# Patient Record
Sex: Female | Born: 1940 | Race: White | Hispanic: No | Marital: Married | Smoking: Never smoker
Health system: Southern US, Community
[De-identification: ages and names within clinical notes are randomized; demographics above are authoritative.]

---

## 2015-02-10 DIAGNOSIS — M75111 Incomplete rotator cuff tear or rupture of right shoulder, not specified as traumatic: Secondary | ICD-10-CM | POA: Insufficient documentation

## 2015-07-22 DIAGNOSIS — M1711 Unilateral primary osteoarthritis, right knee: Secondary | ICD-10-CM | POA: Insufficient documentation

## 2016-06-23 DIAGNOSIS — E663 Overweight: Secondary | ICD-10-CM | POA: Insufficient documentation

## 2016-06-23 DIAGNOSIS — G4733 Obstructive sleep apnea (adult) (pediatric): Secondary | ICD-10-CM | POA: Insufficient documentation

## 2017-01-05 DIAGNOSIS — L02512 Cutaneous abscess of left hand: Secondary | ICD-10-CM | POA: Insufficient documentation

## 2017-01-05 DIAGNOSIS — M009 Pyogenic arthritis, unspecified: Secondary | ICD-10-CM | POA: Insufficient documentation

## 2017-01-05 DIAGNOSIS — E039 Hypothyroidism, unspecified: Secondary | ICD-10-CM | POA: Insufficient documentation

## 2017-01-05 DIAGNOSIS — E785 Hyperlipidemia, unspecified: Secondary | ICD-10-CM | POA: Insufficient documentation

## 2017-01-05 DIAGNOSIS — A4901 Methicillin susceptible Staphylococcus aureus infection, unspecified site: Secondary | ICD-10-CM | POA: Insufficient documentation

## 2017-01-06 DIAGNOSIS — Z9889 Other specified postprocedural states: Secondary | ICD-10-CM | POA: Insufficient documentation

## 2019-12-29 ENCOUNTER — Other Ambulatory Visit: Payer: Self-pay

## 2019-12-29 ENCOUNTER — Ambulatory Visit: Payer: Medicare HMO | Admitting: Podiatry

## 2019-12-29 ENCOUNTER — Ambulatory Visit (INDEPENDENT_AMBULATORY_CARE_PROVIDER_SITE_OTHER): Payer: Medicare HMO

## 2019-12-29 ENCOUNTER — Encounter: Payer: Self-pay | Admitting: Podiatry

## 2019-12-29 VITALS — BP 110/65 | HR 49 | Temp 98.0°F | Resp 16

## 2019-12-29 DIAGNOSIS — M79671 Pain in right foot: Secondary | ICD-10-CM

## 2019-12-29 DIAGNOSIS — M7741 Metatarsalgia, right foot: Secondary | ICD-10-CM

## 2019-12-29 DIAGNOSIS — M779 Enthesopathy, unspecified: Secondary | ICD-10-CM

## 2019-12-29 DIAGNOSIS — M2012 Hallux valgus (acquired), left foot: Secondary | ICD-10-CM

## 2019-12-29 DIAGNOSIS — M7742 Metatarsalgia, left foot: Secondary | ICD-10-CM

## 2019-12-29 DIAGNOSIS — K219 Gastro-esophageal reflux disease without esophagitis: Secondary | ICD-10-CM | POA: Insufficient documentation

## 2019-12-29 DIAGNOSIS — M8580 Other specified disorders of bone density and structure, unspecified site: Secondary | ICD-10-CM | POA: Insufficient documentation

## 2019-12-29 DIAGNOSIS — G5760 Lesion of plantar nerve, unspecified lower limb: Secondary | ICD-10-CM | POA: Insufficient documentation

## 2019-12-29 DIAGNOSIS — M79672 Pain in left foot: Secondary | ICD-10-CM

## 2019-12-29 NOTE — Patient Instructions (Addendum)
Bunion  A bunion is a bump on the base of the big toe that forms when the bones of the big toe joint move out of position. Bunions may be small at first, but they often get larger over time. They can make walking painful. What are the causes? A bunion may be caused by:  Wearing narrow or pointed shoes that force the big toe to press against the other toes.  Abnormal foot development that causes the foot to roll inward (pronate).  Changes in the foot that are caused by certain diseases, such as rheumatoid arthritis or polio.  A foot injury. What increases the risk? The following factors may make you more likely to develop this condition:  Wearing shoes that squeeze the toes together.  Having certain diseases, such as: ? Rheumatoid arthritis. ? Polio. ? Cerebral palsy.  Having family members who have bunions.  Being born with a foot deformity, such as flat feet or low arches.  Doing activities that put a lot of pressure on the feet, such as ballet dancing. What are the signs or symptoms? The main symptom of a bunion is a noticeable bump on the big toe. Other symptoms may include:  Pain.  Swelling around the big toe.  Redness and inflammation.  Thick or hardened skin on the big toe or between the toes.  Stiffness or loss of motion in the big toe.  Trouble with walking. How is this diagnosed? A bunion may be diagnosed based on your symptoms, medical history, and activities. You may have tests, such as:  X-rays. These allow your health care provider to check the position of the bones in your foot and look for damage to your joint. They also help your health care provider determine the severity of your bunion and the best way to treat it.  Joint aspiration. In this test, a sample of fluid is removed from the toe joint. This test may be done if you are in a lot of pain. It helps rule out diseases that cause painful swelling of the joints, such as arthritis. How is this  treated? Treatment depends on the severity of your symptoms. The goal of treatment is to relieve symptoms and prevent the bunion from getting worse. Your health care provider may recommend:  Wearing shoes that have a wide toe box.  Using bunion pads to cushion the affected area.  Taping your toes together to keep them in a normal position.  Placing a device inside your shoe (orthotics) to help reduce pressure on your toe joint.  Taking medicine to ease pain, inflammation, and swelling.  Applying heat or ice to the affected area.  Doing stretching exercises.  Surgery to remove scar tissue and move the toes back into their normal position. This treatment is rare. Follow these instructions at home: Managing pain, stiffness, and swelling   If directed, put ice on the painful area: ? Put ice in a plastic bag. ? Place a towel between your skin and the bag. ? Leave the ice on for 20 minutes, 2-3 times a day. Activity   If directed, apply heat to the affected area before you exercise. Use the heat source that your health care provider recommends, such as a moist heat pack or a heating pad. ? Place a towel between your skin and the heat source. ? Leave the heat on for 20-30 minutes. ? Remove the heat if your skin turns bright red. This is especially important if you are unable to feel pain,   heat, or cold. You may have a greater risk of getting burned.  Do exercises as told by your health care provider. General instructions  Support your toe joint with proper footwear, shoe padding, or taping as told by your health care provider.  Take over-the-counter and prescription medicines only as told by your health care provider.  Keep all follow-up visits as told by your health care provider. This is important. Contact a health care provider if your symptoms:  Get worse.  Do not improve in 2 weeks. Get help right away if you have:  Severe pain and trouble with walking. Summary  A  bunion is a bump on the base of the big toe that forms when the bones of the big toe joint move out of position.  Bunions can make walking painful.  Treatment depends on the severity of your symptoms.  Support your toe joint with proper footwear, shoe padding, or taping as told by your health care provider. This information is not intended to replace advice given to you by your health care provider. Make sure you discuss any questions you have with your health care provider. Document Revised: 05/30/2018 Document Reviewed: 04/05/2018 Elsevier Patient Education  2020 Elsevier Inc.    Hammer Toe  Hammer toe is a change in the shape (a deformity) of your toe. The deformity causes the middle joint of your toe to stay bent. This causes pain, especially when you are wearing shoes. Hammer toe starts gradually. At first, the toe can be straightened. Gradually over time, the deformity becomes stiff and permanent. Early treatments to keep the toe straight may relieve pain. As the deformity becomes stiff and permanent, surgery may be needed to straighten the toe. What are the causes? Hammer toe is caused by abnormal bending of the toe joint that is closest to your foot. It happens gradually over time. This pulls on the muscles and connections (tendons) of the toe joint, making them weak and stiff. It is often related to wearing shoes that are too short or narrow and do not let your toes straighten. What increases the risk? You may be at greater risk for hammer toe if you:  Are female.  Are older.  Wear shoes that are too small.  Wear high-heeled shoes that pinch your toes.  Are a ballet dancer.  Have a second toe that is longer than your big toe (first toe).  Injure your foot or toe.  Have arthritis.  Have a family history of hammer toe.  Have a nerve or muscle disorder. What are the signs or symptoms? The main symptoms of this condition are pain and deformity of the toe. The pain is  worse when wearing shoes, walking, or running. Other symptoms may include:  Corns or calluses over the bent part of the toe or between the toes.  Redness and a burning feeling on the toe.  An open sore that forms on the top of the toe.  Not being able to straighten the toe. How is this diagnosed? This condition is diagnosed based on your symptoms and a physical exam. During the exam, your health care provider will try to straighten your toe to see how stiff the deformity is. You may also have tests, such as:  A blood test to check for rheumatoid arthritis.  An X-ray to show how severe the deformity is. How is this treated? Treatment for this condition will depend on how stiff the deformity is. Surgery is often needed. However, sometimes a hammer   toe can be straightened without surgery. Treatments that do not involve surgery include:  Taping the toe into a straightened position.  Using pads and cushions to protect the toe (orthotics).  Wearing shoes that provide enough room for the toes.  Doing toe-stretching exercises at home.  Taking an NSAID to reduce pain and swelling. If these treatments do not help or the toe cannot be straightened, surgery is the next option. The most common surgeries used to straighten a hammer toe include:  Arthroplasty. In this procedure, part of the joint is removed, and that allows the toe to straighten.  Fusion. In this procedure, cartilage between the two bones of the joint is taken out and the bones are fused together into one longer bone.  Implantation. In this procedure, part of the bone is removed and replaced with an implant to let the toe move again.  Flexor tendon transfer. In this procedure, the tendons that curl the toes down (flexor tendons) are repositioned. Follow these instructions at home:  Take over-the-counter and prescription medicines only as told by your health care provider.  Do toe straightening and stretching exercises as  told by your health care provider.  Keep all follow-up visits as told by your health care provider. This is important. How is this prevented?  Wear shoes that give your toes enough room and do not cause pain.  Do not wear high-heeled shoes. Contact a health care provider if:  Your pain gets worse.  Your toe becomes red or swollen.  You develop an open sore on your toe. This information is not intended to replace advice given to you by your health care provider. Make sure you discuss any questions you have with your health care provider. Document Revised: 11/05/2017 Document Reviewed: 03/18/2016 Elsevier Patient Education  2020 Elsevier Inc.  

## 2019-12-29 NOTE — Progress Notes (Signed)
Subjective:    Patient ID: Doris Griffin, female    DOB: 15-Jul-1941, 79 y.o.   MRN: [id number]  HPI  79 year old female presents the office with concerns of bilateral foot pain mostly on the forefoot bilaterally as well as the toes on the right third and fourth toes.  She states that she is present bunion surgery in the right sinus ago and she is concerned that she has a neuroma as she has the numbness to her toes.  She feels that she is walking on rocks in the balls of her feet. No recent injury or trauma. No swelling. No radiating pain or weakness.     Review of Systems  All other systems reviewed and are negative.  History reviewed. No pertinent past medical history.  History reviewed. No pertinent surgical history.   Current Outpatient Medications:  .  aspirin 81 MG EC tablet, Take by mouth., Disp: , Rfl:  .  atorvastatin (LIPITOR) 10 MG tablet, Take by mouth., Disp: , Rfl:  .  calcium-vitamin D (OSCAL WITH D) 500-200 MG-UNIT TABS tablet, Take by mouth., Disp: , Rfl:  .  co-enzyme Q-10 30 MG capsule, Take by mouth., Disp: , Rfl:  .  fluticasone (FLONASE) 50 MCG/ACT nasal spray, Place into the nose., Disp: , Rfl:  .  levothyroxine (SYNTHROID) 50 MCG tablet, TAKE 1 TABLET EVERY DAY AT 6AM, Disp: , Rfl:  .  Lutein 6 MG TABS, Take by mouth., Disp: , Rfl:  .  Misc. Devices MISC, New cpap machine  at 7 cm. Water and mask fitting DME: [facility] fax: [phone], Disp: , Rfl:  .  Multiple Vitamin (MULTIVITAMIN) tablet, Take by mouth., Disp: , Rfl:  .  nitroGLYCERIN (NITROSTAT) 0.4 MG SL tablet, Place under the tongue., Disp: , Rfl:  .  Omega-3 Fatty Acids (FISH OIL) 1000 MG CAPS, Take by mouth., Disp: , Rfl:  .  omeprazole (PRILOSEC) 20 MG capsule, TAKE 1 CAPSULE BY MOUTH EVERY DAY, Disp: , Rfl:   Allergies  Allergen Reactions  . Other Other (See Comments)    Analgesics- GI irritation Analgesics- GI irritation          Objective:   Physical Exam  General: AAO x3,  NAD  Dermatological: Hyperkeratotic lesions present on the right lateral third toe and medial fourth digit.  No ongoing ulceration, drainage or any signs of infection are noted.  No open lesions otherwise.  Vascular: Dorsalis Pedis artery and Posterior Tibial artery pedal pulses are 2/4 bilateral with immedate capillary fill time. There is no pain with calf compression, swelling, warmth, erythema.   Neruologic: Overall sensation appears to be intact with minimal decrease in the digits.  Musculoskeletal: Prominent metatarsal heads plantarly with atrophy of fat pad.  Not able to identify any area of neuroma I can palpate.  On the right foot there is medial deviation of the lesser digits.  The left foot there is significant bunion deformity identified.  Muscular strength 5/5 in all groups tested bilateral.  Gait: Unassisted, Nonantalgic.     Assessment & Plan:  79 year old female with hyperkeratotic lesions due to digital deformity with metatarsalgia -Treatment options discussed including all alternatives, risks, and complications -Etiology of symptoms were discussed -X-rays were obtained and reviewed with the patient. Significant bunion is present in the left side.  Medial deviation of the lesser digits on the right side.  No evidence of acute fracture. -I debrided the hyperkeratotic lesions x2 without any complications or bleeding.  Dispensed offloading pads.  Discussed shoe  modifications. -Discussed metatarsal offloading pads which were also dispensed.-Discussed shoe modifications and orthotics for the metatarsalgia.  Return if symptoms worsen or fail to improve.  Trula Slade DPM

## 2020-01-01 ENCOUNTER — Ambulatory Visit: Payer: Medicare HMO | Admitting: Podiatry

## 2020-05-10 ENCOUNTER — Other Ambulatory Visit: Payer: Self-pay

## 2020-05-10 ENCOUNTER — Encounter: Payer: Self-pay | Admitting: Podiatry

## 2020-05-10 ENCOUNTER — Ambulatory Visit: Payer: Medicare HMO | Admitting: Podiatry

## 2020-05-10 VITALS — Temp 97.7°F

## 2020-05-10 DIAGNOSIS — M2012 Hallux valgus (acquired), left foot: Secondary | ICD-10-CM | POA: Insufficient documentation

## 2020-05-10 DIAGNOSIS — M79672 Pain in left foot: Secondary | ICD-10-CM | POA: Diagnosis not present

## 2020-05-10 DIAGNOSIS — M2032 Hallux varus (acquired), left foot: Secondary | ICD-10-CM | POA: Diagnosis not present

## 2020-05-10 NOTE — Progress Notes (Signed)
Subjective: 79 year old female presents the office today for surgical consultation of a left bunion, second hammertoe deformity.  She previously has had bunion as well as hammertoe repair on her right foot has done well and the left side is becoming more painful and she wants go to proceed with surgical invention.  She tried shoe modification,, footing and padding the insignificant improvement at this point she wished to proceed with surgery. Denies any systemic complaints such as fevers, chills, nausea, vomiting. No acute changes since last appointment, and no other complaints at this time.   Objective: AAO x3, NAD DP/PT pulses palpable 2/4 bilaterally, CRT less than 3 seconds Moderate bunion deformities present with erythema to the medial first metatarsal head from irritation inside shoes.  There is no pain or crepitation or restriction of first MPJ range of motion.  Tenderness is directly on the first metatarsal head medially along the bunion.  Hammertoe contractures present with mild erythema the dorsal PIPJ.  No open lesions or pre-ulcerative lesions.  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  Plan: -All treatment options discussed with the patient including all alternatives, risks, complications.  -I reviewed the x-rays with her and we discussed with conservative as well as surgical treatment options at length.  At this time she like to proceed with surgical intervention as she is attempted numerous conservative care without any significant improvement.  She understands this is not a guarantee in the toe may not be as straight as the other side.  She states that she is not excellent cosmetic appearance only the bump which is causing discomfort. -The incision placement as well as the postoperative course was discussed with the patient. I discussed risks of the surgery which include, but not  limited to, infection, bleeding, pain, swelling, need for further surgery, delayed or nonhealing, painful or ugly scar, numbness or sensation changes, over/under correction, recurrence, transfer lesions, further deformity, hardware failure, DVT/PE, loss of toe/foot. Patient understands these risks and wishes to proceed with surgery. The surgical consent was reviewed with the patient all 3 pages were signed. No promises or guarantees were given to the outcome of the procedure. All questions were answered to the best of my ability. Before the surgery the patient was encouraged to call the office if there is any further questions. The surgery will be performed at the [facility] on an outpatient basis. -CAM boot dispensed for postop use.  -Patient encouraged to call the office with any questions, concerns, change in symptoms.   Vivi Barrack DPM

## 2020-05-10 NOTE — Patient Instructions (Signed)
Pre-Operative Instructions  Congratulations, you have decided to take an important step towards improving your quality of life.  You can be assured that the doctors and staff at [facility] will be with you every step of the way.  Here are some important things you should know:  1. Plan to be at the surgery center/hospital at least 1 (one) hour prior to your scheduled time, unless otherwise directed by the surgical center/hospital staff.  You must have a responsible adult accompany you, remain during the surgery and drive you home.  Make sure you have directions to the surgical center/hospital to ensure you arrive on time. 2. If you are having surgery at [facility] or [facility] hospitals, you will need a copy of your medical history and physical form from your family physician within one month prior to the date of surgery. We will give you a form for your primary physician to complete.  3. We make every effort to accommodate the date you request for surgery.  However, there are times where surgery dates or times have to be moved.  We will contact you as soon as possible if a change in schedule is required.   4. No aspirin/ibuprofen for one week before surgery.  If you are on aspirin, any non-steroidal anti-inflammatory medications (Mobic, Aleve, Ibuprofen) should not be taken seven (7) days prior to your surgery.  You make take Tylenol for pain prior to surgery.  5. Medications - If you are taking daily heart and blood pressure medications, seizure, reflux, allergy, asthma, anxiety, pain or diabetes medications, make sure you notify the surgery center/hospital before the day of surgery so they can tell you which medications you should take or avoid the day of surgery. 6. No food or drink after midnight the night before surgery unless directed otherwise by surgical center/hospital staff. 7. No alcoholic beverages 24-hours prior to surgery.  No smoking 24-hours prior or 24-hours after  surgery. 8. Wear loose pants or shorts. They should be loose enough to fit over bandages, boots, and casts. 9. Don't wear slip-on shoes. Sneakers are preferred. 10. Bring your boot with you to the surgery center/hospital.  Also bring crutches or a walker if your physician has prescribed it for you.  If you do not have this equipment, it will be provided for you after surgery. 11. If you have not been contacted by the surgery center/hospital by the day before your surgery, call to confirm the date and time of your surgery. 12. Leave-time from work may vary depending on the type of surgery you have.  Appropriate arrangements should be made prior to surgery with your employer. 13. Prescriptions will be provided immediately following surgery by your doctor.  Fill these as soon as possible after surgery and take the medication as directed. Pain medications will not be refilled on weekends and must be approved by the doctor. 14. Remove nail polish on the operative foot and avoid getting pedicures prior to surgery. 15. Wash the night before surgery.  The night before surgery wash the foot and leg well with water and the antibacterial soap provided. Be sure to pay special attention to beneath the toenails and in between the toes.  Wash for at least three (3) minutes. Rinse thoroughly with water and dry well with a towel.  Perform this wash unless told not to do so by your physician.  Enclosed: 1 Ice pack (please put in freezer the night before surgery)   1 Hibiclens skin cleaner     Pre-op instructions  If you have any questions regarding the instructions, please do not hesitate to call our office.  [place]: [address], [place], [place] [address] -- [phone]  [place]: [address] [place], [place] [address] -- [phone]  [place]: [address], [place], [place] [address] -- [phone]   Website: [email or web address] 

## 2020-06-28 ENCOUNTER — Encounter: Payer: Medicare HMO | Admitting: Podiatry

## 2020-07-04 DIAGNOSIS — G8929 Other chronic pain: Secondary | ICD-10-CM | POA: Insufficient documentation

## 2020-07-04 DIAGNOSIS — R6889 Other general symptoms and signs: Secondary | ICD-10-CM | POA: Insufficient documentation

## 2020-07-05 ENCOUNTER — Encounter: Payer: Medicare HMO | Admitting: Podiatry

## 2020-07-16 ENCOUNTER — Telehealth: Payer: Self-pay | Admitting: Podiatry

## 2020-07-17 NOTE — Telephone Encounter (Signed)
I have sx scheduled in a week from today. I'm trying my best to fulfill the information at [facility]. I'm having a tough time. I tried calling the pre-op nurse they suggest you call and I couldn't get an answer, the phones were not working yesterday, I could not get the information filled out on the computer, I don't know what's the matter. I'm just wondering now should I have sx at such a place. Please call me and advise me what to do. Thank you.

## 2020-07-17 NOTE — Telephone Encounter (Signed)
I have sx scheduled on 08/18 and I have some questions. If you could call me on my home number. Thank you.

## 2020-07-19 ENCOUNTER — Encounter: Payer: Medicare HMO | Admitting: Podiatry

## 2020-07-22 ENCOUNTER — Telehealth: Payer: Self-pay | Admitting: Podiatry

## 2020-07-22 NOTE — Telephone Encounter (Signed)
DOS: 07/24/2020  [employer] [place] [employer] [phone])  [employer] [id number])  [employer] Effective: 12/08/2019 -  Deductible: $0. Out of Pocket: $5,000 with $428.75 met and $4,571.25 remains. Copay: $200 CoInsurance: 100%  Per Diane J no prior authorizations is required. Call Ref# [id number].

## 2020-07-24 ENCOUNTER — Other Ambulatory Visit: Payer: Self-pay | Admitting: Podiatry

## 2020-07-24 ENCOUNTER — Encounter: Payer: Self-pay | Admitting: Podiatry

## 2020-07-24 DIAGNOSIS — M2042 Other hammer toe(s) (acquired), left foot: Secondary | ICD-10-CM

## 2020-07-24 DIAGNOSIS — M2012 Hallux valgus (acquired), left foot: Secondary | ICD-10-CM

## 2020-07-24 MED ORDER — CEPHALEXIN 500 MG PO CAPS: 500.0000 mg | ORAL_CAPSULE | Freq: Three times a day (TID) | ORAL | 0 refills | Status: DC

## 2020-07-24 MED ORDER — TRAMADOL HCL 50 MG PO TABS: 50.0000 mg | ORAL_TABLET | Freq: Three times a day (TID) | ORAL | 0 refills | Status: AC | PRN

## 2020-07-24 MED ORDER — ONDANSETRON HCL 4 MG PO TABS: 4.0000 mg | ORAL_TABLET | Freq: Three times a day (TID) | ORAL | 0 refills | Status: DC | PRN

## 2020-07-24 MED ORDER — IBUPROFEN 600 MG PO TABS: 600.0000 mg | ORAL_TABLET | Freq: Three times a day (TID) | ORAL | 0 refills | Status: AC | PRN

## 2020-07-24 NOTE — Progress Notes (Signed)
Postop medications sent 

## 2020-07-25 ENCOUNTER — Telehealth: Payer: Self-pay | Admitting: Podiatry

## 2020-07-25 NOTE — Telephone Encounter (Signed)
Patient had surgery yesterday and has a couple of questions regarding her recovery and ask when she will be able to sleep without her boot.  Please give patient a call.

## 2020-07-26 NOTE — Telephone Encounter (Signed)
Called and spoke with the patient and the patient wanted to know if the boot could come off at night when in the bed and I advised to keep boot on and could take off if laying down and can ice under the knee and elevate, patient stated that there was some nauseous due to the pain medicine and the tramadol patient took Wednesday and is staying off of it and I stated to call the office if any concerns or questions. Misty Stanley

## 2020-07-29 ENCOUNTER — Encounter: Payer: Medicare HMO | Admitting: Podiatry

## 2020-07-29 ENCOUNTER — Telehealth: Payer: Self-pay | Admitting: *Deleted

## 2020-07-29 NOTE — Telephone Encounter (Signed)
Patient called this morning and was hurting so we scheduled patient for 4:30 today and I called patient back today at 4:58 pm and stated that I was calling to check on patient and the patient stated that when she first got up it was hurting and then patient stated that she had elevated and iced it and it did feel better and when she took the boot off for a little while that seemed to help a lot and feel better and patient stated also that she has had no pain medicine today and I stated to call the office if any concerns or questions. Misty Stanley

## 2020-08-02 ENCOUNTER — Ambulatory Visit (INDEPENDENT_AMBULATORY_CARE_PROVIDER_SITE_OTHER): Payer: Medicare HMO

## 2020-08-02 ENCOUNTER — Other Ambulatory Visit: Payer: Self-pay | Admitting: Podiatry

## 2020-08-02 ENCOUNTER — Telehealth: Payer: Self-pay | Admitting: Podiatry

## 2020-08-02 ENCOUNTER — Ambulatory Visit (INDEPENDENT_AMBULATORY_CARE_PROVIDER_SITE_OTHER): Payer: Medicare HMO | Admitting: Podiatry

## 2020-08-02 ENCOUNTER — Encounter: Payer: Self-pay | Admitting: Podiatry

## 2020-08-02 ENCOUNTER — Other Ambulatory Visit: Payer: Self-pay

## 2020-08-02 VITALS — BP 144/74 | HR 60 | Temp 97.1°F | Resp 16

## 2020-08-02 DIAGNOSIS — M2012 Hallux valgus (acquired), left foot: Secondary | ICD-10-CM

## 2020-08-02 DIAGNOSIS — M2042 Other hammer toe(s) (acquired), left foot: Secondary | ICD-10-CM | POA: Diagnosis not present

## 2020-08-02 DIAGNOSIS — M79672 Pain in left foot: Secondary | ICD-10-CM

## 2020-08-02 DIAGNOSIS — M2032 Hallux varus (acquired), left foot: Secondary | ICD-10-CM

## 2020-08-02 MED ORDER — TRAMADOL HCL 50 MG PO TABS
50.0000 mg | ORAL_TABLET | Freq: Three times a day (TID) | ORAL | 0 refills | Status: AC | PRN
Start: 2020-08-02 — End: 2020-08-07

## 2020-08-02 NOTE — Telephone Encounter (Signed)
Pt called and wanted to make sure tramadol was called into her pharmacy

## 2020-08-02 NOTE — Telephone Encounter (Signed)
I sent it  ?Thanks ?

## 2020-08-02 NOTE — Progress Notes (Signed)
Subjective: Doris Griffin is a 79 y.o. is seen today in office s/p left foot [place] bunionectomy and 2nd digit hammertoe repair eformed on 07/24/2020. She had called earlier this week and was having pain. We scheduled her for the [place] office but apparently the foot was feeling better so she decided to wait to come in today. She takes tramadol at night. She has been wearing the boot and she has been icing and elevating.  Pain is improving. Denies any systemic complaints such as fevers, chills, nausea, vomiting. No calf pain, chest pain, shortness of breath.   Objective: General: No acute distress, AAOx3  DP/PT pulses palpable 2/4, CRT < 3 sec to all digits.  Protective sensation intact. Motor function intact.  LEFT foot: Incision is well coapted without any evidence of dehiscence with sutures intact. There is no surrounding erythema, ascending cellulitis, fluctuance, crepitus, malodor, drainage/purulence. There is mied edema and eccymosis around the surgical site. There is mild pain along the surgical site. K-wire intact to the 2nd toe without any drainage/pus. Toes are in a rectus position.  No other areas of tenderness to bilateral lower extremities.  No other open lesions or pre-ulcerative lesions.  No pain with calf compression, swelling, warmth, erythema.   Assessment and Plan:  Status post left foot surgery, doing well with no complications   -Treatment options discussed including all alternatives, risks, and complications -Antibiotic ointment and bandage applied. Keep the dressing clean, dry, intact.  -Remain in surgical boot. Dispensed surgical shoe that she can wear while sleeping for for short distances. Otherwise remain in the CAM boot.  -Pain medication as needed- only taking this at night.  -Ice/elevation -Pain medication as needed. -Monitor for any clinical signs or symptoms of infection and DVT/PE and directed to call the office immediately should any occur or go to the  ER. -Follow-up as scheduled or sooner if any problems arise. In the meantime, encouraged to call the office with any questions, concerns, change in symptoms.   Ovid Curd, DPM

## 2020-08-09 ENCOUNTER — Ambulatory Visit (INDEPENDENT_AMBULATORY_CARE_PROVIDER_SITE_OTHER): Payer: Medicare HMO | Admitting: Podiatry

## 2020-08-09 ENCOUNTER — Encounter: Payer: Self-pay | Admitting: Podiatry

## 2020-08-09 ENCOUNTER — Other Ambulatory Visit: Payer: Self-pay

## 2020-08-09 VITALS — Temp 96.1°F

## 2020-08-09 DIAGNOSIS — M2012 Hallux valgus (acquired), left foot: Secondary | ICD-10-CM

## 2020-08-09 DIAGNOSIS — M2042 Other hammer toe(s) (acquired), left foot: Secondary | ICD-10-CM

## 2020-08-09 NOTE — Progress Notes (Signed)
Subjective: Doris Griffin is a 79 y.o. is seen today in office s/p left foot Austin bunionectomy and 2nd digit hammertoe repair eformed on 07/24/2020. She presents today for possible suture removal. The pain is better controlled. Her pain level at maximum is 4/10. She gets some tingly sensation to the toes but she is able to move them. She is not taking any pain mediation. Denies any systemic complaints such as fevers, chills, nausea, vomiting. No calf pain, chest pain, shortness of breath.   Objective: General: No acute distress, AAOx3  DP/PT pulses palpable 2/4, CRT < 3 sec to all digits.  Protective sensation intact. Motor function intact.  LEFT foot: Incision is well coapted without any evidence of dehiscence with sutures intact.  K wire is intact of the second toe without any drainage.  Toes are in disposition.  Still small hallux abductus present but she states it looks much better compared to prior to surgery.  There is mild edema. Mild ecchymosis. There is no pain  No other open lesions or pre-ulcerative lesions.  No pain with calf compression, swelling, warmth, erythema.   Assessment and Plan:  Status post left foot surgery, doing well with no complications   -Treatment options discussed including all alternatives, risks, and complications -Sutures removed from the second toe without complication incision remained well coapted. -Antibiotic ointment was applied followed by dry sterile dressing.  I held the hallux in a rectus position. -Continue with cam boot.  She can use a surgical shoe around the house.  Encouraged ice elevation. -Pain medication as needed but she has not been requiring this.  Follow-up in 2 weeks  Vivi Barrack DPM

## 2020-08-14 ENCOUNTER — Telehealth: Payer: Self-pay | Admitting: Podiatrist

## 2020-08-14 ENCOUNTER — Ambulatory Visit (INDEPENDENT_AMBULATORY_CARE_PROVIDER_SITE_OTHER): Payer: Medicare HMO

## 2020-08-14 ENCOUNTER — Other Ambulatory Visit: Payer: Self-pay | Admitting: Podiatry

## 2020-08-14 ENCOUNTER — Ambulatory Visit (INDEPENDENT_AMBULATORY_CARE_PROVIDER_SITE_OTHER): Payer: Medicare HMO | Admitting: Podiatry

## 2020-08-14 ENCOUNTER — Other Ambulatory Visit: Payer: Self-pay

## 2020-08-14 DIAGNOSIS — M2042 Other hammer toe(s) (acquired), left foot: Secondary | ICD-10-CM | POA: Diagnosis not present

## 2020-08-14 DIAGNOSIS — M79672 Pain in left foot: Secondary | ICD-10-CM

## 2020-08-14 MED ORDER — CEPHALEXIN 500 MG PO CAPS: 500.0000 mg | ORAL_CAPSULE | Freq: Three times a day (TID) | ORAL | 0 refills | Status: AC

## 2020-08-14 NOTE — Telephone Encounter (Signed)
I called patient back. The wire is turning and anytime it touches anything it hurts. Discussed that if the pin is that loose then we will likely go ahead and remove it today.

## 2020-08-14 NOTE — Telephone Encounter (Signed)
Patient called and left a message stating her pin in her toe is is painful and sensitive and possibly working its way out.  She wants to know if she can have the pin taken out sooner than scheduled.  She wants to come in this Friday to the [place] office instead of the following Friday.   Dos 9/18/ 2021.  Thanks!

## 2020-08-17 NOTE — Progress Notes (Signed)
  Subjective:  Patient ID: Doris Griffin, female    DOB: October 13, 1941,  MRN: [id number]  Chief Complaint  Patient presents with  . Routine Post Op    PT stated that Dr.wagnor called her and said that she could come in and have the pin removed becuase it has worked itself  loose    79 y.o. female presents with the above complaint. History confirmed with patient.   Objective:  Physical Exam: warm, good capillary refill, no trophic changes or ulcerative lesions, normal DP and PT pulses and normal sensory exam. Left Foot: Bandages are clean dry and intact, the K wire from the second digit is nearly completely out, the digit is erythematousbut the wound is well-healed and there is no drainage  Radiographs: X-ray of the left foot: Post removal radiographs show some early osseous bridging but has not healed at the PIPJ Assessment:   1. Left foot pain      Plan:  Patient was evaluated and treated and all questions answered.   -Saw and evaluated the patient today with Dr. Loreta Ave.  Recommended we go and remove the pin even though it is about a week or 2 early. -Post removal radiographs taken -Rx for cephalexin sent to pharmacy. -She will follow-up with Dr. Ardelle Anton next week  No follow-ups on file.

## 2020-08-23 ENCOUNTER — Other Ambulatory Visit: Payer: Self-pay

## 2020-08-23 ENCOUNTER — Ambulatory Visit (INDEPENDENT_AMBULATORY_CARE_PROVIDER_SITE_OTHER): Payer: Medicare HMO

## 2020-08-23 ENCOUNTER — Ambulatory Visit (INDEPENDENT_AMBULATORY_CARE_PROVIDER_SITE_OTHER): Payer: Medicare HMO | Admitting: Podiatry

## 2020-08-23 DIAGNOSIS — M2012 Hallux valgus (acquired), left foot: Secondary | ICD-10-CM

## 2020-08-23 DIAGNOSIS — M2042 Other hammer toe(s) (acquired), left foot: Secondary | ICD-10-CM

## 2020-08-23 NOTE — Progress Notes (Signed)
Subjective: Doris Griffin is a 79 y.o. is seen today in office s/p left foot Austin bunionectomy and 2nd digit hammertoe repair eformed on 07/24/2020. She states she is doing well and since it has been removed the pain has much improved. She still gets some occasional discomfort but not taking any pain medication. She states that she is happy with the surgery and the talus it is healing well and straight. Denies any systemic complaints such as fevers, chills, nausea, vomiting. No calf pain, chest pain, shortness of breath.   Objective: General: No acute distress, AAOx3  DP/PT pulses palpable 2/4, CRT < 3 sec to all digits.  Protective sensation intact. Motor function intact.  LEFT foot: Incision is well coapted without any evidence of dehiscence with sutures intact. The toes are in rectus position. The incisions are healing well with any signs of infection. There is minimal edema to the foot. No erythema or warmth. No pain with MPJ range of motion. No other open lesions or pre-ulcerative lesions.  No pain with calf compression, swelling, warmth, erythema.   Assessment and Plan:  Status post left foot surgery, doing well with no complications   -Treatment options discussed including all alternatives, risks, and complications -X-rays obtained reviewed and independently reviewed. Status post first metatarsal osteotomy with second hammertoe repair. -Antibiotic ointment and a dressing applied. She can start to shower and get the incision wet. Dry thoroughly and apply a similar bandage. Postoperative dispensed to help pull the hallux in a rectus position. Remain in surgical shoe encourage ice elevation. I will see her back in 2 weeks or sooner if needed. We will likely transition to regular shoe next appointment start physical therapy.  Vivi Barrack DPM

## 2020-09-06 ENCOUNTER — Ambulatory Visit (INDEPENDENT_AMBULATORY_CARE_PROVIDER_SITE_OTHER): Payer: Medicare HMO

## 2020-09-06 ENCOUNTER — Ambulatory Visit (INDEPENDENT_AMBULATORY_CARE_PROVIDER_SITE_OTHER): Payer: Medicare HMO | Admitting: Podiatry

## 2020-09-06 ENCOUNTER — Other Ambulatory Visit: Payer: Self-pay

## 2020-09-06 DIAGNOSIS — M2012 Hallux valgus (acquired), left foot: Secondary | ICD-10-CM

## 2020-09-06 DIAGNOSIS — M2042 Other hammer toe(s) (acquired), left foot: Secondary | ICD-10-CM

## 2020-09-10 NOTE — Progress Notes (Signed)
Subjective: Doris Griffin is a 79 y.o. is seen today in office s/p left foot Austin bunionectomy and 2nd digit hammertoe repair eformed on 07/24/2020. She states that she is doing well and she has no concerns today. She has been in the cam boot but also in surgical shoe at home. No recent injury or falls and she has no other concerns. Denies any systemic complaints such as fevers, chills, nausea, vomiting. No calf pain, chest pain, shortness of breath.   Objective: General: No acute distress, AAOx3  DP/PT pulses palpable 2/4, CRT < 3 sec to all digits.  Protective sensation intact. Motor function intact.  LEFT foot: Incision is well coapted without any evidence of dehiscence and scars are well formed. The toes in rectus position she states that she is very happy with how straight the toes are. Minimal edema. There is no erythema or any signs of infection. Improved range of motion of the MPJ. She has occasional discomfort of the second toe but otherwise she has not taken any medication for this. No other open lesions or pre-ulcerative lesions.  No pain with calf compression, swelling, warmth, erythema.   Assessment and Plan:  Status post left foot surgery, doing well with no complications   -Treatment options discussed including all alternatives, risks, and complications -X-rays ordered -At this time we will start physical therapy and a referral was placed. As she starts physical therapy she can start to transition back into regular shoe as tolerated. Continue ice elevation. Compression as needed for swelling.  Vivi Barrack DPM

## 2020-09-17 ENCOUNTER — Other Ambulatory Visit: Payer: Self-pay

## 2020-09-17 ENCOUNTER — Ambulatory Visit (INDEPENDENT_AMBULATORY_CARE_PROVIDER_SITE_OTHER): Payer: Medicare HMO | Admitting: Physical Therapy

## 2020-09-17 ENCOUNTER — Encounter: Payer: Self-pay | Admitting: Physical Therapy

## 2020-09-17 DIAGNOSIS — M79672 Pain in left foot: Secondary | ICD-10-CM

## 2020-09-17 DIAGNOSIS — R2689 Other abnormalities of gait and mobility: Secondary | ICD-10-CM | POA: Diagnosis not present

## 2020-09-17 NOTE — Patient Instructions (Signed)
Access Code: [email or web address] URL: [email or web address] Date: 09/17/2020 Prepared by: Raynelle Fanning  Exercises Seated Ankle Circles - 1 x daily - 7 x weekly - 2 sets - 10 reps Seated Ankle Inversion Eversion AROM - 1 x daily - 7 x weekly - 2 sets - 10 reps Gastroc Stretch on Wall - 2 x daily - 7 x weekly - 3 reps - 1 sets - 30-60 sec hold Single Leg Stance - 1 x daily - 7 x weekly - 3 sets - 10 reps

## 2020-09-17 NOTE — Therapy (Signed)
[facility] [place] [address] [place], [place], [address] Phone: [phone]   Fax:  [phone]  Physical Therapy Evaluation  Patient Details  Name: Doris Griffin MRN: [id number] Date of Birth: Feb 26, 1941 Referring Provider (PT): Ovid Curd DPM   Encounter Date: 09/17/2020   PT End of Session - 09/17/20 1155    Visit Number 1    Date for PT Re-Evaluation 10/29/20    Authorization Type [employer] MCR    Progress Note Due on Visit 10    PT Start Time 1155    PT Stop Time 1239    PT Time Calculation (min) 44 min    Activity Tolerance Patient tolerated treatment well    Behavior During Therapy [facility] for tasks assessed/performed           History reviewed. No pertinent past medical history.  History reviewed. No pertinent surgical history.  There were no vitals filed for this visit.    Subjective Assessment - 09/17/20 1157    Subjective Patient had left bunionectomy 07/24/20. She has been out of boot for 1.5 weeks. Reports new pain this morning on top of foot and lateral. Wearing Clark's slip ons today but has good feet supports for athletic shoes but too tight right now.    Pertinent History right bunionectomy and 2nd toe Hammertoe correction 2013, Morton's neuroma bil, lumbar DDD    How long can you walk comfortably? 10-15 min    Patient Stated Goals to get back to waking    Currently in Pain? Yes    Pain Score 4     Pain Location Foot    Pain Orientation Left    Pain Descriptors / Indicators Aching    Pain Type Acute pain    Pain Radiating Towards lateral and dorum of foot    Pain Onset More than a month ago    Pain Frequency Constant    Aggravating Factors  walking    Pain Relieving Factors ice    Effect of Pain on Daily Activities can't do 35 min walks daily              [facility] PT Assessment - 09/17/20 0001      Assessment   Medical Diagnosis hallux valgus left foot    Referring Provider (PT) Ovid Curd DPM    Onset Date/Surgical Date 07/24/20    Next MD Visit 10/04/20    Prior Therapy no      Precautions   Precautions None      Restrictions   Weight Bearing Restrictions No      Balance Screen   Has the patient fallen in the past 6 months No    Has the patient had a decrease in activity level because of a fear of falling?  No    Is the patient reluctant to leave their home because of a fear of falling?  No      Home Environment   Living Environment Private residence    Living Arrangements Spouse/significant other    Type of Home House    Additional Comments 2 steps to garage with railings; doesn't use upstairs at home      Prior Function   Level of Independence Independent    Vocation Retired    Leisure daily walking      Observation/Other Assessments   Observations mid and forefoot edema Left compared to right    Focus on Therapeutic Outcomes (FOTO)  41% Limited (28% goal)  Functional Tests   Functional tests Single leg stance      Single Leg Stance   Comments 3 sec Rt; 6 sec Lt      Posture/Postural Control   Posture Comments left hallux valgus, bil pes planus      ROM / Strength   AROM / PROM / Strength AROM;PROM;Strength      AROM   Overall AROM Comments left great toe ext 55 deg, 20 flex    AROM Assessment Site Ankle;Other (comment)    Right/Left Ankle Left    Left Ankle Dorsiflexion 0    Left Ankle Plantar Flexion 60    Left Ankle Inversion 40    Left Ankle Eversion 25      PROM   PROM Assessment Site Ankle    Right/Left Ankle Left    Left Ankle Dorsiflexion 5      Strength   Overall Strength Comments lt hip flex 4+/5, Rt 4/5, Lt hip ABD 4+/5, bil knee ext 5/5,    Strength Assessment Site Ankle    Right/Left Ankle Left    Left Ankle Plantar Flexion 5/5    Left Ankle Inversion 5/5    Left Ankle Eversion 4+/5      Flexibility   Soft Tissue Assessment /Muscle Length --   Left hip WNL     Palpation   Palpation comment left extensor  digitorum brevis marked tenderness, else WNL      Ambulation/Gait   Ambulation/Gait Yes    Ambulation/Gait Assistance 7: Independent    Ambulation Distance (Feet) 30 Feet    Assistive device None    Gait Pattern Decreased step length - right;Decreased stance time - left;Step-through pattern;Decreased dorsiflexion - left    Ambulation Surface Level    Gait Comments antalgic                       Objective measurements completed on examination: See above findings.               PT Education - 09/17/20 1253    Education Details HEP    Person(s) Educated Patient    Methods Explanation;Demonstration;Handout    Comprehension Verbalized understanding               PT Long Term Goals - 09/17/20 1301      PT LONG TERM GOAL #1   Title Patient able to amb community distances safely without paiin or deviation.    Time 6    Period Weeks    Status New    Target Date 10/29/20      PT LONG TERM GOAL #2   Title Patient to demo SLS x 10 sec bil to decrease fall risk.    Time 6    Period Weeks    Status New      PT LONG TERM GOAL #3   Title Improved left ankle dorsiflexion to >=5 degrees to help normalize gait    Time 6    Period Weeks    Status New      PT LONG TERM GOAL #4   Title Ind with HEP    Time 6    Period Weeks    Status New      PT LONG TERM GOAL #5   Title Improved FOTO limitations to <= 29%    Time 6    Period Weeks    Status New  Plan - 09/17/20 1241    Clinical Impression Statement Patient presents s/p left foot bunionectomy and 2nd digit hammer toe repair on 07/24/20. She demonstrates left hallux valgus and bil pes planus,  mild ROM deficits in the great toe and strength deficits in the Lt LE. She reports new acute pain this morning in the extensor digitorum brevis muscles which may be from the slip on shoes she has been wearing. She is just out of the boot for 1.5 weeks and her athletic shoes are too tight  due to edema. She has an antalgic gait with decreased stance time left and step length right. She also demonstrates decreased balance with SLS bil. She will benefit from PT to address these deficits.    Personal Factors and Comorbidities Comorbidity 2    Comorbidities bil Morton's Neuroma, right bunionectomy and hammer toe repair    Examination-Activity Limitations Locomotion Level    Stability/Clinical Decision Making Stable/Uncomplicated    Clinical Decision Making Low    Rehab Potential Excellent    PT Frequency 1x / week    PT Duration 6 weeks    PT Treatment/Interventions ADLs/Self Care Home Management;Cryotherapy;Balance training;Therapeutic exercise;Gait training;Patient/family education;Manual techniques;Dry needling;Taping    PT Next Visit Plan bike, gastroc stretch, gait, balance; manual to extensor digitorum brevis, great toe Abduction    PT Home Exercise Plan [email or web address]    Consulted and Agree with Plan of Care Patient           Patient will benefit from skilled therapeutic intervention in order to improve the following deficits and impairments:  Abnormal gait, Decreased range of motion, Increased muscle spasms, Impaired flexibility, Postural dysfunction, Decreased strength, Increased edema, Decreased balance  Visit Diagnosis: Other abnormalities of gait and mobility - Plan: PT plan of care cert/re-cert  Pain in left foot - Plan: PT plan of care cert/re-cert     Problem List Patient Active Problem List   Diagnosis Date Noted  . Chronic low back pain 07/04/2020  . Decreased functional activity tolerance 07/04/2020  . Hallux valgus of left foot 05/10/2020  . Hallux hammertoe, left 05/10/2020  . GERD (gastroesophageal reflux disease) 12/29/2019  . Morton's neuroma 12/29/2019  . Osteopenia 12/29/2019  . Status post incision and drainage 01/06/2017  . Abscess of left index finger 01/05/2017  . Acquired hypothyroidism 01/05/2017  . HLD (hyperlipidemia) 01/05/2017  . MSSA  (methicillin susceptible Staphylococcus aureus) infection 01/05/2017  . Septic arthritis of interphalangeal joint of finger of left hand (HCC) 01/05/2017  . Overweight 06/23/2016  . OSA on CPAP 06/23/2016  . Osteoarthritis of right knee 07/22/2015  . Incomplete tear of right rotator cuff 02/10/2015    Solon Palm PT 09/17/2020, 1:11 PM  [facility] [address] [place] [address] [place], [place], [address] Phone: [phone]   Fax:  [phone]  Name: Doris Griffin MRN: [id number] Date of Birth: 24-Sep-1941

## 2020-09-26 ENCOUNTER — Ambulatory Visit (INDEPENDENT_AMBULATORY_CARE_PROVIDER_SITE_OTHER): Payer: Medicare HMO | Admitting: Physical Therapy

## 2020-09-26 ENCOUNTER — Encounter: Payer: Self-pay | Admitting: Physical Therapy

## 2020-09-26 ENCOUNTER — Other Ambulatory Visit: Payer: Self-pay

## 2020-09-26 DIAGNOSIS — M79672 Pain in left foot: Secondary | ICD-10-CM

## 2020-09-26 DIAGNOSIS — R2689 Other abnormalities of gait and mobility: Secondary | ICD-10-CM | POA: Diagnosis not present

## 2020-09-26 NOTE — Therapy (Signed)
[place], [place], [address] Phone: [phone]   Fax:  [phone]  Physical Therapy Treatment  Patient Details  Name: Doris Griffin MRN: [id number] Date of Birth: 04-22-1941 Referring Provider (PT): Celesta Gentile DPM   Encounter Date: 09/26/2020   PT End of Session - 09/26/20 1147    Visit Number 2    Date for PT Re-Evaluation 10/29/20    Authorization Type [employer] MCR    Progress Note Due on Visit 10    PT Start Time 4784    PT Stop Time 1238    PT Time Calculation (min) 50 min    Activity Tolerance Patient tolerated treatment well    Behavior During Therapy [facility] for tasks assessed/performed           History reviewed. No pertinent past medical history.  History reviewed. No pertinent surgical history.  There were no vitals filed for this visit.   Subjective Assessment - 09/26/20 1151    Subjective Pt reports she is doing well with her HEP.  The swelling is going down and she can get other shoes on. She is concerned she is getting plantar fascitis - as she can't wear her arch supports yet    Pertinent History right bunionectomy and 2nd toe Hammertoe correction 2013, Morton's neuroma bil, lumbar DDD    Patient Stated Goals to get back to waking    Currently in Pain? Yes    Pain Score 3    with walking   Pain Location Foot    Pain Orientation Left    Pain Descriptors / Indicators Aching    Pain Type Acute pain    Pain Onset More than a month ago    Pain Frequency Intermittent    Aggravating Factors  walking and weightbearing    Pain Relieving Factors ice and rest              [facility] PT Assessment - 09/26/20 0001      Assessment   Medical Diagnosis hallux valgus left foot    Referring Provider (PT) Celesta Gentile DPM    Onset Date/Surgical Date 07/24/20    Next MD Visit 09-27-2020                         [facility] Adult PT Treatment/Exercise - 09/26/20 0001        Exercises   Exercises Ankle      Modalities   Modalities Vasopneumatic      Vasopneumatic   Number Minutes Vasopneumatic  10 minutes    Vasopnuematic Location  Ankle    Vasopneumatic Pressure Medium    Vasopneumatic Temperature  34*      Manual Therapy   Manual Therapy Soft tissue mobilization;Passive ROM    Soft tissue mobilization Cross friction massage to Lt foot plantar surface    Passive ROM gently ROM of Lt ankle, forefoot and toes - tender along 4-5th metatarsals       Ankle Exercises: Stretches   Gastroc Stretch 3 reps    Gastroc Stretch Limitations at wall and on slant board       Ankle Exercises: Aerobic   Nustep L5x5' LE only - VC for knee alignment      Ankle Exercises: Seated   BAPS Sitting;Level 2   pt could perfrom with L3 next visit    BAPS Limitations clock work pt tends to hold great toe up in extension during this , performed with  foot on ball to simulate something she can do at home.     Other Seated Ankle Exercises toe grips on towel, attemtped marble pick up - pt unable     Other Seated Ankle Exercises 20 reps erversion with red band      Ankle Exercises: Supine   T-Band 3x10 PF rt foot                       PT Long Term Goals - 09/17/20 1301      PT LONG TERM GOAL #1   Title Patient able to amb community distances safely without paiin or deviation.    Time 6    Period Weeks    Status New    Target Date 10/29/20      PT LONG TERM GOAL #2   Title Patient to demo SLS x 10 sec bil to decrease fall risk.    Time 6    Period Weeks    Status New      PT LONG TERM GOAL #3   Title Improved left ankle dorsiflexion to >=5 degrees to help normalize gait    Time 6    Period Weeks    Status New      PT LONG TERM GOAL #4   Title Ind with HEP    Time 6    Period Weeks    Status New      PT LONG TERM GOAL #5   Title Improved FOTO limitations to <= 29%    Time 6    Period Weeks    Status New                 Plan -  09/26/20 1233    Clinical Impression Statement This is Doris Griffin's second visit, she is able to get a normal shoe on now but cant fit the arch support in yet d/t the swelling.  She has palpable tightness and scar tissue in the Lt plantar fascitiis with some tenderness with manual work. Did well in sitting with L2 BAPs board - can try L3 at next visit.  No goals met at this time.    Rehab Potential Excellent    PT Frequency 1x / week    PT Duration 6 weeks    PT Treatment/Interventions ADLs/Self Care Home Management;Cryotherapy;Balance training;Therapeutic exercise;Gait training;Patient/family education;Manual techniques;Dry needling;Taping    PT Next Visit Plan progress HEP, work on ankle/foot strength, ROM and proprioception, edema managemenmt    PT Home Exercise Plan [email or web address]    Consulted and Agree with Plan of Care Patient           Patient will benefit from skilled therapeutic intervention in order to improve the following deficits and impairments:  Abnormal gait, Decreased range of motion, Increased muscle spasms, Impaired flexibility, Postural dysfunction, Decreased strength, Increased edema, Decreased balance  Visit Diagnosis: Other abnormalities of gait and mobility  Pain in left foot     Problem List Patient Active Problem List   Diagnosis Date Noted   Chronic low back pain 07/04/2020   Decreased functional activity tolerance 07/04/2020   Hallux valgus of left foot 05/10/2020   Hallux hammertoe, left 05/10/2020   GERD (gastroesophageal reflux disease) 12/29/2019   Morton's neuroma 12/29/2019   Osteopenia 12/29/2019   Status post incision and drainage 01/06/2017   Abscess of left index finger 01/05/2017   Acquired hypothyroidism 01/05/2017   HLD (hyperlipidemia) 01/05/2017   MSSA (methicillin susceptible Staphylococcus aureus) infection 01/05/2017  Septic arthritis of interphalangeal joint of finger of left hand ([place]) 01/05/2017   Overweight 06/23/2016    OSA on CPAP 06/23/2016   Osteoarthritis of right knee 07/22/2015   Incomplete tear of right rotator cuff 02/10/2015    Jeral Pinch PT  09/26/2020, 12:42 PM  [facility] [place], [place], [address] Phone: [phone]   Fax:  [phone]  Name: Doris Griffin MRN: [id number] Date of Birth: 04-20-1941

## 2020-09-27 ENCOUNTER — Ambulatory Visit: Payer: Medicare HMO | Admitting: Podiatry

## 2020-09-27 DIAGNOSIS — M2012 Hallux valgus (acquired), left foot: Secondary | ICD-10-CM

## 2020-09-27 DIAGNOSIS — M779 Enthesopathy, unspecified: Secondary | ICD-10-CM

## 2020-09-30 NOTE — Progress Notes (Signed)
Subjective: Doris Griffin is a 79 y.o. is seen today in office s/p left foot Austin bunionectomy and 2nd digit hammertoe repair peformed on 07/24/2020.  She originally moved her appointment up a week because as she started physical therapy she was having discomfort to the lateral aspect of her foot.  Since doing physical therapy it did help after the last appointment with him.  She says it is getting better.  Surgical site is doing well with any pain or swelling. Denies any systemic complaints such as fevers, chills, nausea, vomiting. No calf pain, chest pain, shortness of breath.   Objective: General: No acute distress, AAOx3  DP/PT pulses palpable 2/4, CRT < 3 sec to all digits.  Protective sensation intact. Motor function intact.  LEFT foot: Incision is well coapted without any evidence of dehiscence and scars are well formed.  There is no pain or restriction with first IPJ range of motion is no pain at surgical site.  The majority discomfort along the course of the peroneal tendons on the lateral aspect.  Overall the tendon appears to be intact.  There is trace edema.  There is no erythema or warmth.  No other areas of discomfort identified today. No other open lesions or pre-ulcerative lesions.  No pain with calf compression, swelling, warmth, erythema.   Assessment and Plan:  Status post left foot surgery, peroneal tendinitis  -Treatment options discussed including all alternatives, risks, and complications -From a surgical standpoint she is doing well.  Continue physical therapy.  I think her peroneal tendinitis as a result of compensation.  She can use ankle brace as needed but continue physical therapy, ice to the area as well as anti-inflammatories.  Vivi Barrack DPM

## 2020-10-01 ENCOUNTER — Ambulatory Visit: Payer: Medicare HMO | Admitting: Physical Therapy

## 2020-10-01 ENCOUNTER — Other Ambulatory Visit: Payer: Self-pay

## 2020-10-01 ENCOUNTER — Encounter: Payer: Self-pay | Admitting: Physical Therapy

## 2020-10-01 DIAGNOSIS — M79672 Pain in left foot: Secondary | ICD-10-CM

## 2020-10-01 DIAGNOSIS — R2689 Other abnormalities of gait and mobility: Secondary | ICD-10-CM | POA: Diagnosis not present

## 2020-10-01 NOTE — Therapy (Signed)
[place], [place], [address] Phone: [phone]   Fax:  [phone]  Physical Therapy Treatment  Patient Details  Name: Doris Griffin MRN: [id number] Date of Birth: 04-12-1941 Referring Provider (PT): Celesta Gentile DPM   Encounter Date: 10/01/2020   PT End of Session - 10/01/20 1148    Visit Number 3    Date for PT Re-Evaluation 10/29/20    Authorization Type [employer] MCR    Progress Note Due on Visit 10    PT Start Time 1150    PT Stop Time 1235    PT Time Calculation (min) 45 min    Activity Tolerance Patient tolerated treatment well    Behavior During Therapy [facility] for tasks assessed/performed           History reviewed. No pertinent past medical history.  History reviewed. No pertinent surgical history.  There were no vitals filed for this visit.   Subjective Assessment - 10/01/20 1150    Subjective It's getting better every day. Sheet doesn't hurt when it rubs on her foot either. Relief in plantar fascia after last visit.    Pertinent History right bunionectomy and 2nd toe Hammertoe correction 2013, Morton's neuroma bil, lumbar DDD    How long can you walk comfortably? 15-20 min    Patient Stated Goals to get back to waking    Currently in Pain? Yes    Pain Score 1     Pain Location Foot    Pain Orientation Left    Pain Descriptors / Indicators Aching    Pain Type Acute pain                             [facility] Adult PT Treatment/Exercise - 10/01/20 0001      Modalities   Modalities Vasopneumatic      Vasopneumatic   Number Minutes Vasopneumatic  10 minutes    Vasopnuematic Location  Ankle    Vasopneumatic Pressure Medium    Vasopneumatic Temperature  34      Manual Therapy   Manual Therapy Soft tissue mobilization    Soft tissue mobilization IASTM to extensor digitorum brevis and to tendons around lateral malleolus      Ankle Exercises: Aerobic    Recumbent Bike L2 x 5 min      Ankle Exercises: Standing   SLS multiple reps bil 10 sec Right; 4 sec left (pt wearing clogs with heel today)      Ankle Exercises: Seated   Towel Crunch 5 reps   2 sets   BAPS Sitting;Level 3   pt could perfrom with L3 next visit    BAPS Limitations clock work pt tends to hold great toe up in extension during this , performed with foot on ball to simulate something she can do at home.     Other Seated Ankle Exercises  great toe ABD isometrics with towel between great toes 5 sec x 5    Other Seated Ankle Exercises 20 reps eversion with red band      Ankle Exercises: Stretches   Gastroc Stretch 1 rep;60 seconds    Gastroc Stretch Limitations at wall    Other Stretch seated great toe lumbrical stretch x 60 sec (pulling into ABDuction)      Ankle Exercises: Plyometrics   Plyometric Exercises ..........................................................................................................................................................................................................................................................................Marland Kitchen  PT Long Term Goals - 10/01/20 1153      PT LONG TERM GOAL #1   Title Patient able to amb community distances safely without paiin or deviation.    Status On-going      PT LONG TERM GOAL #2   Title Patient to demo SLS x 10 sec bil to decrease fall risk.    Status On-going      PT LONG TERM GOAL #4   Title Ind with HEP    Status Partially Met                 Plan - 10/01/20 1253    Clinical Impression Statement Patient presents today with decreased pain in the left foot and arch since last visit. She is able to walk further but stil has swelling limiting use of arch support. She still has difficulty bil with SLS but was able to stand 10 sec on right after multiple tries. LTGs are progressing.    Personal Factors and Comorbidities Comorbidity 2     Comorbidities bil Morton's Neuroma, right bunionectomy and hammer toe repair    Examination-Activity Limitations Locomotion Level    PT Frequency 1x / week    PT Duration 6 weeks    PT Treatment/Interventions ADLs/Self Care Home Management;Cryotherapy;Balance training;Therapeutic exercise;Gait training;Patient/family education;Manual techniques;Dry needling;Taping    PT Next Visit Plan progress HEP, work on ankle/foot strength, ROM, balance and proprioception, edema management; possible DN to lumbricals.    PT Home Exercise Plan [id number]    Consulted and Agree with Plan of Care Patient           Patient will benefit from skilled therapeutic intervention in order to improve the following deficits and impairments:  Abnormal gait, Decreased range of motion, Increased muscle spasms, Impaired flexibility, Postural dysfunction, Decreased strength, Increased edema, Decreased balance  Visit Diagnosis: Other abnormalities of gait and mobility  Pain in left foot     Problem List Patient Active Problem List   Diagnosis Date Noted  . Chronic low back pain 07/04/2020  . Decreased functional activity tolerance 07/04/2020  . Hallux valgus of left foot 05/10/2020  . Hallux hammertoe, left 05/10/2020  . GERD (gastroesophageal reflux disease) 12/29/2019  . Morton's neuroma 12/29/2019  . Osteopenia 12/29/2019  . Status post incision and drainage 01/06/2017  . Abscess of left index finger 01/05/2017  . Acquired hypothyroidism 01/05/2017  . HLD (hyperlipidemia) 01/05/2017  . MSSA (methicillin susceptible Staphylococcus aureus) infection 01/05/2017  . Septic arthritis of interphalangeal joint of finger of left hand ([place]) 01/05/2017  . Overweight 06/23/2016  . OSA on CPAP 06/23/2016  . Osteoarthritis of right knee 07/22/2015  . Incomplete tear of right rotator cuff 02/10/2015    Madelyn Flavors PT 10/01/2020, 1:00 PM  [facility] [place]  [place], [place], [address] Phone: [phone]   Fax:  [phone]  Name: Marialena Wollen MRN: [id number] Date of Birth: [birth year]

## 2020-10-04 ENCOUNTER — Ambulatory Visit: Payer: Medicare HMO | Admitting: Podiatry

## 2020-10-10 ENCOUNTER — Ambulatory Visit (INDEPENDENT_AMBULATORY_CARE_PROVIDER_SITE_OTHER): Payer: Medicare HMO | Admitting: Physical Therapy

## 2020-10-10 ENCOUNTER — Other Ambulatory Visit: Payer: Self-pay

## 2020-10-10 ENCOUNTER — Encounter: Payer: Self-pay | Admitting: Physical Therapy

## 2020-10-10 DIAGNOSIS — R2689 Other abnormalities of gait and mobility: Secondary | ICD-10-CM

## 2020-10-10 DIAGNOSIS — M79672 Pain in left foot: Secondary | ICD-10-CM

## 2020-10-10 NOTE — Therapy (Addendum)
[place], [place], [address] Phone: [phone]   Fax:  [phone]  Physical Therapy Treatment And Discharge Summary  Patient Details  Name: Doris Griffin MRN: [id number] Date of Birth: 1941-12-04 Referring Provider (PT): Celesta Gentile DPM   Encounter Date: 10/10/2020   PT End of Session - 10/10/20 1406    Visit Number 4    Date for PT Re-Evaluation 10/29/20    Authorization Type [employer] MCR    Progress Note Due on Visit 10    PT Start Time 1406    PT Stop Time 1450    PT Time Calculation (min) 44 min    Activity Tolerance Patient tolerated treatment well    Behavior During Therapy [facility] for tasks assessed/performed           History reviewed. No pertinent past medical history.  History reviewed. No pertinent surgical history.  There were no vitals filed for this visit.   Subjective Assessment - 10/10/20 1406    Subjective Patient reports her foot is much improved. Still having some pain on top.    Pertinent History right bunionectomy and 2nd toe Hammertoe correction 2013, Morton's neuroma bil, lumbar DDD    How long can you walk comfortably? 25 min    Patient Stated Goals to get back to waking    Currently in Pain? No/denies              [facility] PT Assessment - 10/10/20 0001      AROM   Right/Left Ankle Left    Left Ankle Dorsiflexion 5                         [facility] Adult PT Treatment/Exercise - 10/10/20 0001      Self-Care   Self-Care Other Self-Care Comments    Other Self-Care Comments  discussed POC and concerns re: hallux valgus      Manual Therapy   Manual Therapy Soft tissue mobilization    Soft tissue mobilization to extensor digitorum brevis and to tendons around lateral malleolus and plantar fascia       Ankle Exercises: Aerobic   Recumbent Bike L2 x 3 min      Ankle Exercises: Stretches   Gastroc Stretch 1 rep;60 seconds    Gastroc Stretch  Limitations at wall    Other Stretch seated extensor digitorum stretch x 60 sec      Ankle Exercises: Seated   Towel Crunch 5 reps   2 sets   BAPS Sitting;Level 3   pt could perfrom with L3 next visit    BAPS Limitations clock work pt working to stop great toe extension during this     Other Seated Ankle Exercises great toe ABD x 10 with PT assist    Other Seated Ankle Exercises 20 reps eversion with red band      Ankle Exercises: Standing   SLS multiple reps bil 10 sec Right; 6 sec left                   PT Education - 10/10/20 1558    Education Details HEP - added stretch for extensor brevis, arch work and SLS    Person(s) Educated Patient    Methods Explanation;Demonstration    Comprehension Verbalized understanding;Returned demonstration               PT Long Term Goals - 10/10/20 1412      PT LONG TERM  GOAL #1   Title Patient able to amb community distances safely without pain or deviation.    Status Achieved      PT LONG TERM GOAL #2   Title Patient to demo SLS x 10 sec bil to decrease fall risk.    Baseline 6 sec on left no shoe    Status On-going      PT LONG TERM GOAL #3   Title Improved left ankle dorsiflexion to >=5 degrees to help normalize gait    Status Achieved      PT LONG TERM GOAL #4   Title Ind with HEP    Status Partially Met                 Plan - 10/10/20 1600    Clinical Impression Statement Patient is progressing well with LTGs. She is able to walk 25 min without dificulty and her ankle DF is WNL. She has decreased pain overall, but is still experiencing pain in the extensor digitorum brevis at night. Muscle tension here has decreased considerably. She still has SLS deficits on her left, but her Morton's neuroma affects this somewhat. She is concerned with the amount of hallux valgus in the left great toe. PT encouraged wearing the spacer more frequently and avoiding shoes without a wide toe plate. Patient wishes to be placed on  hold for 2 weeks.    Comorbidities bil Morton's Neuroma, right bunionectomy and hammer toe repair    PT Treatment/Interventions ADLs/Self Care Home Management;Cryotherapy;Balance training;Therapeutic exercise;Gait training;Patient/family education;Manual techniques;Dry needling;Taping    PT Next Visit Plan on hold until 10/24/20. If pt doesn't return, she is pleased with level of function.    PT Home Exercise Plan [id number]           Patient will benefit from skilled therapeutic intervention in order to improve the following deficits and impairments:  Abnormal gait, Decreased range of motion, Increased muscle spasms, Impaired flexibility, Postural dysfunction, Decreased strength, Increased edema, Decreased balance  Visit Diagnosis: Other abnormalities of gait and mobility  Pain in left foot     Problem List Patient Active Problem List   Diagnosis Date Noted  . Chronic low back pain 07/04/2020  . Decreased functional activity tolerance 07/04/2020  . Hallux valgus of left foot 05/10/2020  . Hallux hammertoe, left 05/10/2020  . GERD (gastroesophageal reflux disease) 12/29/2019  . Morton's neuroma 12/29/2019  . Osteopenia 12/29/2019  . Status post incision and drainage 01/06/2017  . Abscess of left index finger 01/05/2017  . Acquired hypothyroidism 01/05/2017  . HLD (hyperlipidemia) 01/05/2017  . MSSA (methicillin susceptible Staphylococcus aureus) infection 01/05/2017  . Septic arthritis of interphalangeal joint of finger of left hand ([place]) 01/05/2017  . Overweight 06/23/2016  . OSA on CPAP 06/23/2016  . Osteoarthritis of right knee 07/22/2015  . Incomplete tear of right rotator cuff 02/10/2015    Madelyn Flavors PT 10/10/2020, 4:10 PM  [facility] [place], [place], [address] Phone: [phone]   Fax:  [phone]  Name: Kenlee Vogt MRN: [id number] Date of Birth: 1941-07-20  PHYSICAL THERAPY  DISCHARGE SUMMARY  Visits from Start of Care: 4  Current functional level related to goals / functional outcomes: unknown   Remaining deficits: unknown   Education / Equipment: HEP  Plan: Patient agrees to discharge.  Patient goals were not met. Patient is being discharged due to not returning since the last visit.  ?????     Madelyn Flavors, PT 11/13/20 8:02  AM  [place] [facility] [place], [place] [address]  [phone] (office) [phone] (fax)

## 2020-11-08 ENCOUNTER — Ambulatory Visit: Payer: Medicare HMO | Admitting: Podiatry

## 2020-11-08 ENCOUNTER — Other Ambulatory Visit: Payer: Self-pay

## 2020-11-08 DIAGNOSIS — M2012 Hallux valgus (acquired), left foot: Secondary | ICD-10-CM

## 2020-11-08 DIAGNOSIS — M779 Enthesopathy, unspecified: Secondary | ICD-10-CM

## 2020-11-13 NOTE — Progress Notes (Signed)
Subjective: Doris Griffin is a 79 y.o. is seen today in office s/p left foot [place] bunionectomy and 2nd digit hammertoe repair peformed on 07/24/2020.  She states that she has been doing well she denies any issues with her foot.  The tendinitis of her foot is also improving.  She states that her cycles are still which is helpful.  She is wearing regular shoes.  No recent injury or trauma any changes since I last saw her.  No new concerns. Denies any systemic complaints such as fevers, chills, nausea, vomiting. No calf pain, chest pain, shortness of breath.   Objective: General: No acute distress, AAOx3  DP/PT pulses palpable 2/4, CRT < 3 sec to all digits.  Protective sensation intact. Motor function intact.  LEFT foot: Incision is well coapted without any evidence of dehiscence and scars are well formed.  There is no pain on the MPJ range of motion.  No pain the surgical site.  No significant edema.  There is tenderness along the peroneal tendons improving.  Flexor, extensor tendons appear to be intact otherwise. No other open lesions or pre-ulcerative lesions.  No pain with calf compression, swelling, warmth, erythema.   Assessment and Plan:  Status post left foot surgery, peroneal tendinitis  -Treatment options discussed including all alternatives, risks, and complications -She overall is improving.  No significant pain to the bunion site and the tendinitis is improving.  She is back to regular shoe.  Dispensed for a toe separator for her.  At this point we will see her back as needed she agrees with this plan has no further questions or concerns today.  Vivi Barrack DPM

## 2021-01-29 IMAGING — DX DG FOOT COMPLETE 3+V*L*
3 series · 3 of 3 positions shown · non-contrast
Comparison: None.

CLINICAL DATA: Postop bunion surgery

EXAM:
LEFT FOOT - COMPLETE 3+ VIEW

[foot ap]
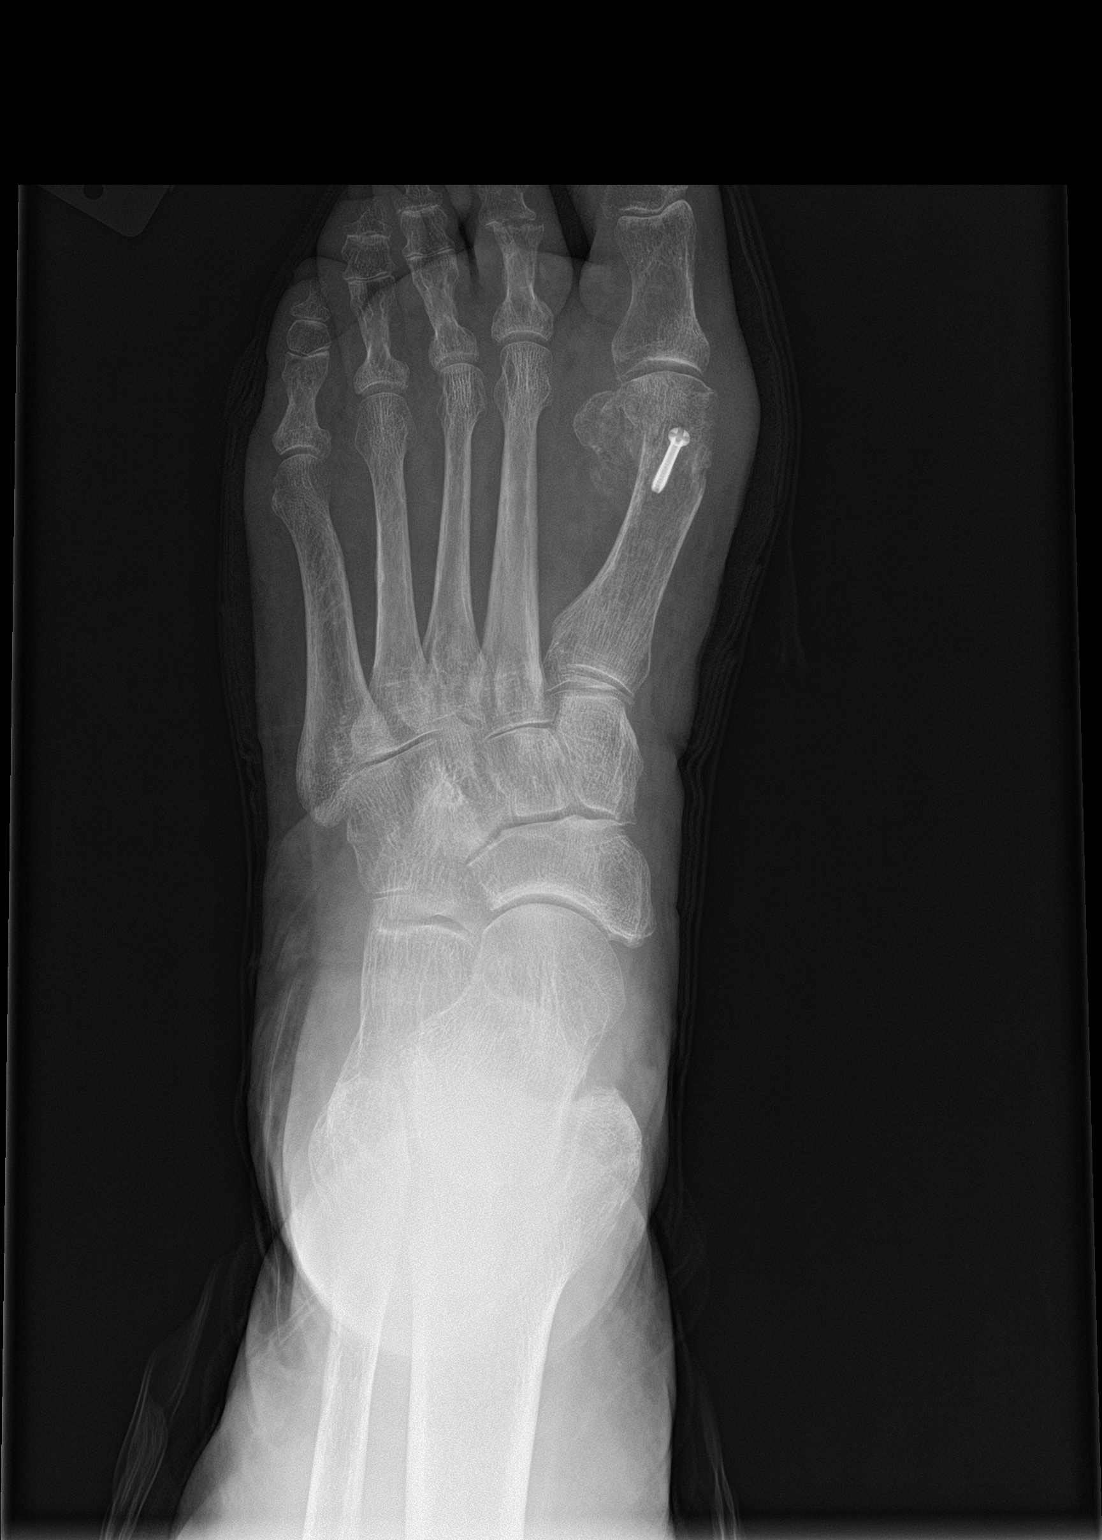

[foot obl]
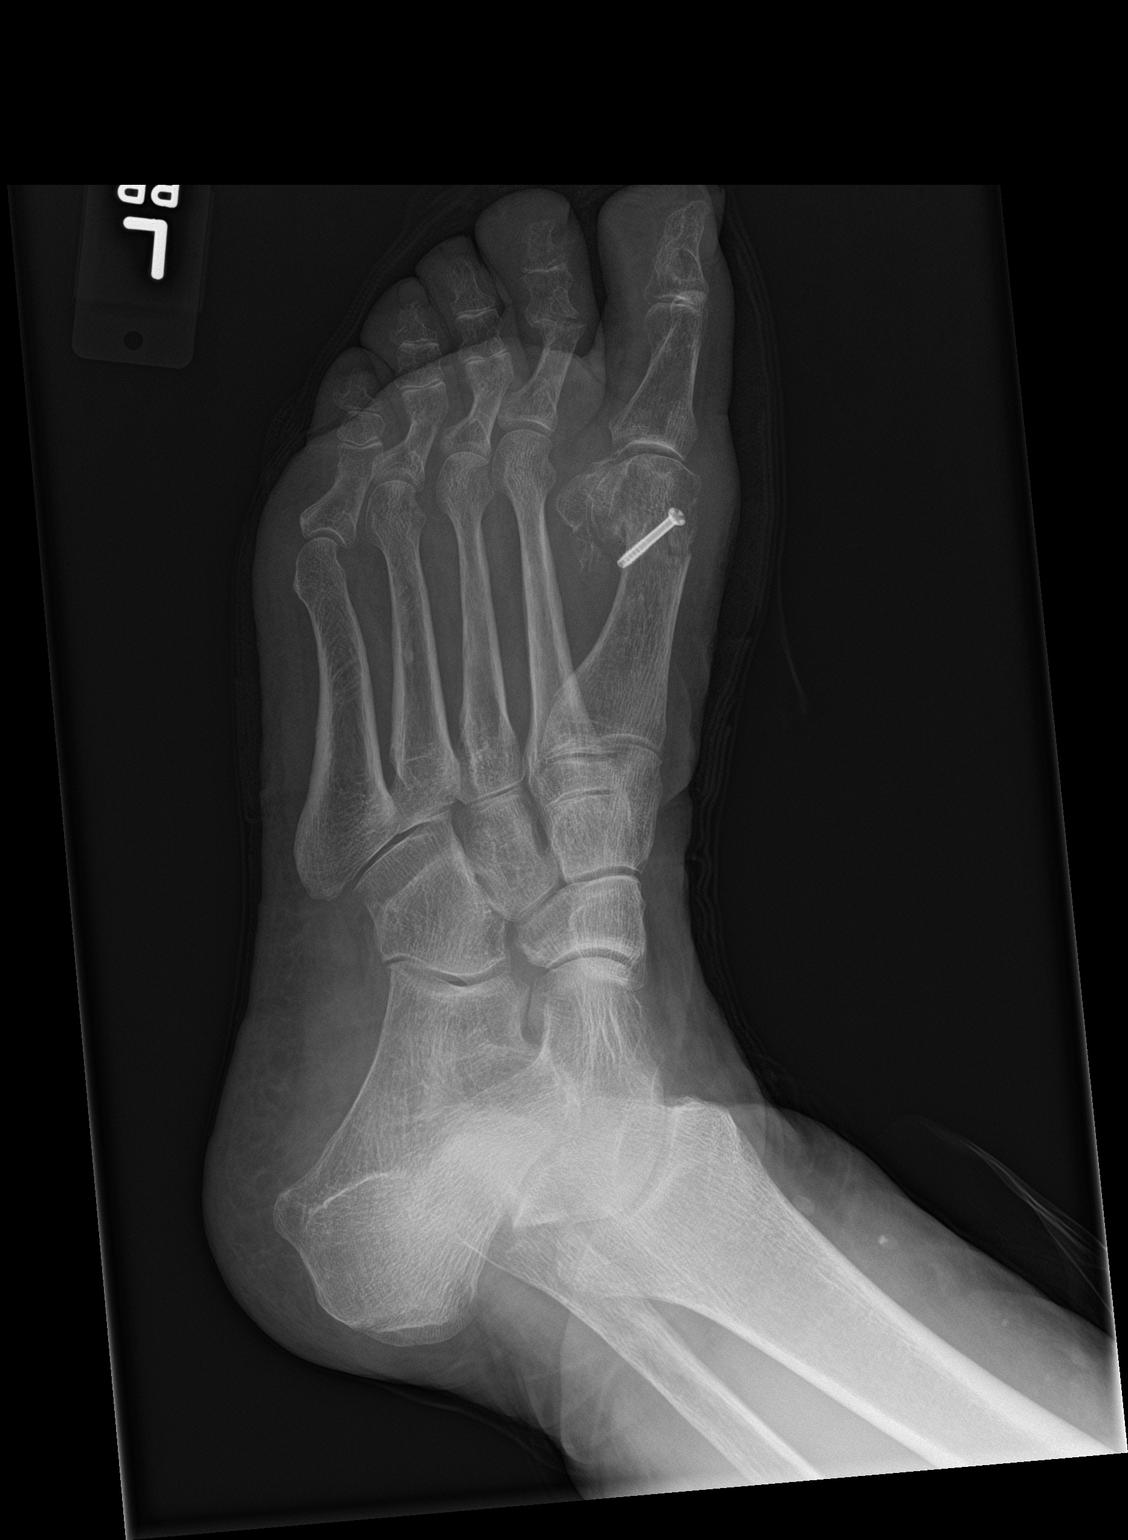

[foot lat]
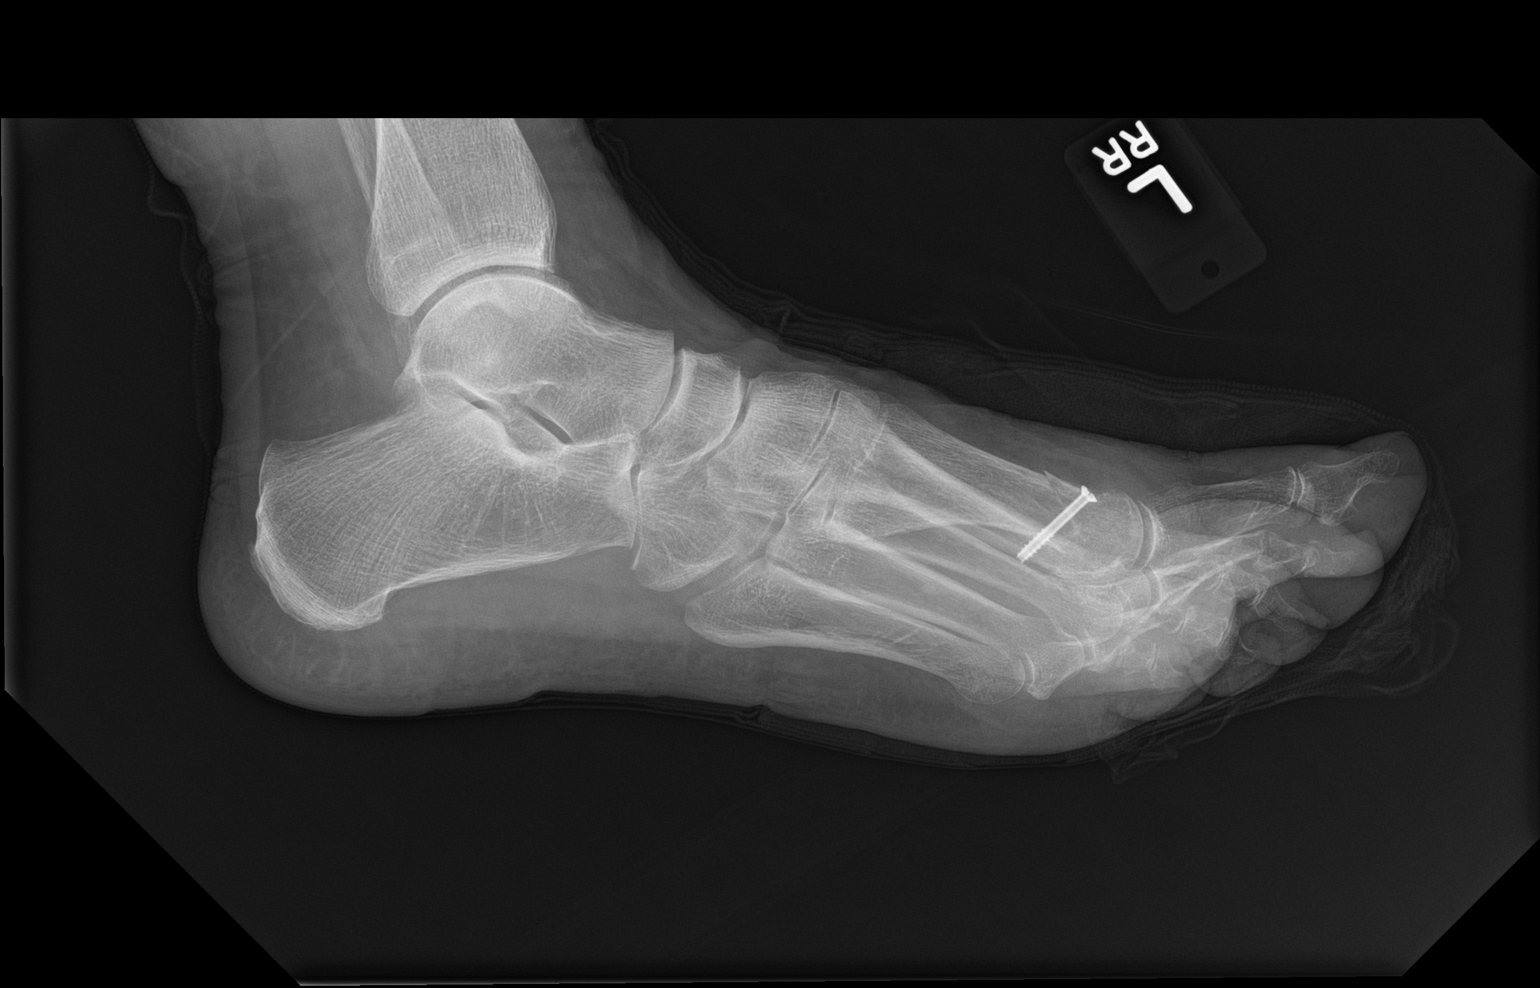

[3 of 3 positions shown; findings below may reference images not displayed]

FINDINGS: Generalized osteopenia. Interval left first metatarsal neck
osteotomy transfixed with a single screw. Mild osteoarthritis of the
first MTP joint.

No acute fracture or dislocation. No aggressive osseous lesion. Soft
tissue swelling around the first metatarsal head likely
postsurgical.
IMPRESSION: Interval left first metatarsal neck osteotomy transfixed with a
single screw. Mild osteoarthritis of the first MTP joint.

## 2021-07-28 ENCOUNTER — Other Ambulatory Visit: Payer: Self-pay

## 2021-07-28 ENCOUNTER — Encounter: Payer: Self-pay | Admitting: Podiatrist

## 2021-07-28 ENCOUNTER — Ambulatory Visit: Payer: Medicare HMO | Admitting: Podiatrist

## 2021-07-28 DIAGNOSIS — M2041 Other hammer toe(s) (acquired), right foot: Secondary | ICD-10-CM

## 2021-07-28 DIAGNOSIS — L84 Corns and callosities: Secondary | ICD-10-CM | POA: Diagnosis not present

## 2021-07-28 NOTE — Patient Instructions (Signed)
Corns and Calluses Corns are small areas of thickened skin that form on the top, sides, or tip of a toe. Corns have a cone-shaped core with a point that can press on a nerve below. This causes pain. Calluses are areas of thickened skin that can form anywhere on the body, including the hands, fingers, palms, soles of the feet, and heels. Calluses are usually larger than corns. What are the causes? Corns and calluses are caused by rubbing (friction) or pressure, such as from shoes that are too tight or do not fit properly. What increases the risk? Corns are more likely to develop in people who have misshapen toes (toe deformities), such as hammer toes. Calluses can form with friction to any area of the skin. They are more likely to develop in people who: Work with their hands. Wear shoes that fit poorly, are too tight, or are high-heeled. Have toe deformities. What are the signs or symptoms? Symptoms of a corn or callus include: A hard growth on the skin. Pain or tenderness under the skin. Redness and swelling. Increased discomfort while wearing tight-fitting shoes, if your feet are affected. If a corn or callus becomes infected, symptoms may include: Redness and swelling that gets worse. Pain. Fluid, blood, or pus draining from the corn or callus. How is this diagnosed? Corns and calluses may be diagnosed based on your symptoms, your medical history, and a physical exam. How is this treated? Treatment for corns and calluses may include: Removing the cause of the friction or pressure. This may involve: Changing your shoes. Wearing shoe inserts (orthotics) or other protective layers in your shoes, such as a corn pad. Wearing gloves. Applying medicine to the skin (topical medicine) to help soften skin in the hardened, thickened areas. Removing layers of dead skin with a file to reduce the size of the corn or callus. Removing the corn or callus with a scalpel or laser. Taking antibiotic  medicines, if your corn or callus is infected. Having surgery, if a toe deformity is the cause. Follow these instructions at home:  Take over-the-counter and prescription medicines only as told by your health care provider. If you were prescribed an antibiotic medicine, take it as told by your health care provider. Do not stop taking it even if your condition improves. Wear shoes that fit well. Avoid wearing high-heeled shoes and shoes that are too tight or too loose. Wear any padding, protective layers, gloves, or orthotics as told by your health care provider. Soak your hands or feet. Then use a file or pumice stone to soften your corn or callus. Do this as told by your health care provider. Check your corn or callus every day for signs of infection. Contact a health care provider if: Your symptoms do not improve with treatment. You have redness or swelling that gets worse. Your corn or callus becomes painful. You have fluid, blood, or pus coming from your corn or callus. You have new symptoms. Get help right away if: You develop severe pain with redness. Summary Corns are small areas of thickened skin that form on the top, sides, or tip of a toe. These can be painful. Calluses are areas of thickened skin that can form anywhere on the body, including the hands, fingers, palms, and soles of the feet. Calluses are usually larger than corns. Corns and calluses are caused by rubbing (friction) or pressure, such as from shoes that are too tight or do not fit properly. Treatment may include wearing padding, protective   layers, gloves, or orthotics as told by your health care provider. This information is not intended to replace advice given to you by your health care provider. Make sure you discuss any questions you have with your health care provider. Document Revised: 03/21/2020 Document Reviewed: 03/21/2020 Elsevier Patient Education  2022 Elsevier Inc.  

## 2021-07-28 NOTE — Progress Notes (Signed)
Chief Complaint  Patient presents with   Callouses    Corn between 3rd toe of right foot      HPI: Patient is 80 y.o. female who presents today for the concerns as listed above. She has had the corn on her right foot between the third and fourth toes for only about a month.  She has tried corn pads which have failed to relieve her pain.   Patient Active Problem List   Diagnosis Date Noted   Chronic low back pain 07/04/2020   Decreased functional activity tolerance 07/04/2020   Hallux valgus of left foot 05/10/2020   Hallux hammertoe, left 05/10/2020   GERD (gastroesophageal reflux disease) 12/29/2019   Morton's neuroma 12/29/2019   Osteopenia 12/29/2019   Status post incision and drainage 01/06/2017   Abscess of left index finger 01/05/2017   Acquired hypothyroidism 01/05/2017   HLD (hyperlipidemia) 01/05/2017   MSSA (methicillin susceptible Staphylococcus aureus) infection 01/05/2017   Septic arthritis of interphalangeal joint of finger of left hand ([facility]) 01/05/2017   Overweight 06/23/2016   OSA on CPAP 06/23/2016   Osteoarthritis of right knee 07/22/2015   Incomplete tear of right rotator cuff 02/10/2015    Current Outpatient Medications on File Prior to Visit  Medication Sig Dispense Refill   ammonium lactate (AMLACTIN) 12 % cream APPLY TO SKIN DAILY     aspirin 81 MG EC tablet Take by mouth.     atorvastatin (LIPITOR) 10 MG tablet Take by mouth.     calcium-vitamin D (OSCAL WITH D) 500-200 MG-UNIT TABS tablet Take by mouth.     cephALEXin (KEFLEX) 500 MG capsule Take 1 capsule (500 mg total) by mouth 3 (three) times daily. (Patient not taking: Reported on 09/17/2020) 30 capsule 0   co-enzyme Q-10 30 MG capsule Take by mouth.     fluticasone (FLONASE) 50 MCG/ACT nasal spray Place into the nose.     ibuprofen (ADVIL) 600 MG tablet Take 1 tablet (600 mg total) by mouth every 8 (eight) hours as needed. 30 tablet 0   levothyroxine (SYNTHROID) 50 MCG tablet TAKE 1 TABLET  EVERY DAY AT 6AM     Lutein 6 MG TABS Take by mouth.     Misc. Devices MISC New cpap machine  at 7 cm. Water and mask fitting DME: [facility] fax: [phone]     Multiple Vitamin (MULTIVITAMIN) tablet Take by mouth.     nitroGLYCERIN (NITROSTAT) 0.4 MG SL tablet Place under the tongue.     Omega-3 Fatty Acids (FISH OIL) 1000 MG CAPS Take by mouth.     omeprazole (PRILOSEC) 20 MG capsule TAKE 1 CAPSULE BY MOUTH EVERY DAY     TURMERIC PO Take by mouth.     No current facility-administered medications on file prior to visit.    Allergies  Allergen Reactions   Other Other (See Comments)    Analgesics- GI irritation Analgesics- GI irritation    Tramadol     Pt stated, "This upset my stomach, made me feel nauseous, I took Zofran with it and that helped"    Review of Systems No fevers, chills, nausea, muscle aches, no difficulty breathing, no calf pain, no chest pain or shortness of breath.   Physical Exam  GENERAL APPEARANCE: Alert, conversant. Appropriately groomed. No acute distress.   VASCULAR: Pedal pulses palpable DP and PT bilateral.  Capillary refill time is immediate to all digits,  Proximal to distal cooling it warm to warm.  Digital perfusion adequate.   NEUROLOGIC:  sensation is intact to 5.07 monofilament at 5/5 sites bilateral.  Light touch is intact bilateral, vibratory sensation intact bilateral  MUSCULOSKELETAL: acceptable muscle strength, tone and stability bilateral.  Mild hammertoe of the third digit of the right foot is noted.  Boney prominence at the pip joint laterally is noted with an overlying callus present.   DERMATOLOGIC: skin is warm, supple, and dry.  Callus on the lateral aspect of the third toe and medial aspect of the fourth toe noted.    Assessment     ICD-10-CM   1. Corn of toe  L84     2. Hammertoe of right foot  M20.41         Plan  Exam findings discussed with the patient.  Discussed the etiology of a hammertoe and discussed how the  third and fourth toes are rubbing against one another causing the corn to form.  I recommended a steroid injection to try and reduce some of the inflammation and this was carried out today under sterile technique injecting .60ml of kenalog 10 and marcaine plain sublesionally without complication.  I then pared the corn with a 15 blade and applied a pad.  She will try to trim the area herself with an emory board or similar if it returns. Discussed she could ultimately require a hammertoe surgery and she will call if any further treatment options are needed in the future.
